# Patient Record
Sex: Male | Born: 2005 | Hispanic: Yes | Marital: Single | Smoking: Never smoker
Health system: Southern US, Community
[De-identification: ages and names within clinical notes are randomized; demographics above are authoritative.]

## PROBLEM LIST (undated history)

## (undated) DIAGNOSIS — Q359 Cleft palate, unspecified: Secondary | ICD-10-CM

## (undated) DIAGNOSIS — Q249 Congenital malformation of heart, unspecified: Secondary | ICD-10-CM

## (undated) DIAGNOSIS — I459 Conduction disorder, unspecified: Secondary | ICD-10-CM

## (undated) DIAGNOSIS — Q9381 Velo-cardio-facial syndrome: Secondary | ICD-10-CM

## (undated) DIAGNOSIS — Q262 Total anomalous pulmonary venous connection: Secondary | ICD-10-CM

## (undated) DIAGNOSIS — H9190 Unspecified hearing loss, unspecified ear: Secondary | ICD-10-CM

## (undated) HISTORY — DX: Unspecified hearing loss, unspecified ear: H91.90

## (undated) HISTORY — PX: OTHER SURGICAL HISTORY: SHX169

## (undated) HISTORY — PX: BIDIRECTIONAL GLENN W/ PERFUSION: SHX1218

## (undated) HISTORY — DX: Velo-cardio-facial syndrome: Q93.81

## (undated) HISTORY — DX: Conduction disorder, unspecified: I45.9

## (undated) HISTORY — DX: Congenital malformation of heart, unspecified: Q24.9

## (undated) HISTORY — DX: Cleft palate, unspecified: Q35.9

---
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
## 2023-08-12 ENCOUNTER — Other Ambulatory Visit: Payer: Self-pay | Admitting: Pediatrics

## 2023-08-12 DIAGNOSIS — Z950 Presence of cardiac pacemaker: Secondary | ICD-10-CM

## 2023-11-28 ENCOUNTER — Other Ambulatory Visit: Payer: Self-pay | Admitting: Pediatrics

## 2023-11-28 DIAGNOSIS — Z2989 Encounter for other specified prophylactic measures: Secondary | ICD-10-CM

## 2024-02-07 ENCOUNTER — Telehealth: Payer: Self-pay | Admitting: *Deleted

## 2024-02-07 NOTE — Telephone Encounter (Signed)
 Enalapril  prescription change request placed in Dr Chet folder.

## 2024-02-13 NOTE — Telephone Encounter (Signed)
 LVM for patient's cardiologist (Dr. Carliss Pouch at [facility]) to ask if change from Enalapril  to Lisinopril is appropriate.  Mallie Glendia Shorts, MD

## 2024-05-03 ENCOUNTER — Other Ambulatory Visit: Payer: Self-pay | Admitting: Pediatrics

## 2024-05-03 DIAGNOSIS — Z950 Presence of cardiac pacemaker: Secondary | ICD-10-CM
